# Patient Record
Sex: Female | Born: 1990 | Race: White | Hispanic: No | Marital: Married | Smoking: Current every day smoker
Health system: Southern US, Community
[De-identification: ages and names within clinical notes are randomized; demographics above are authoritative.]

---
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
## 2008-02-10 ENCOUNTER — Emergency Department: Payer: Self-pay | Admitting: Emergency Medicine

## 2010-05-07 ENCOUNTER — Emergency Department: Payer: Self-pay | Admitting: Emergency Medicine

## 2012-01-21 ENCOUNTER — Inpatient Hospital Stay: Payer: Self-pay

## 2012-01-22 LAB — CBC WITH DIFFERENTIAL/PLATELET
Basophil #: 0.1 10*3/uL (ref 0.0–0.1)
Basophil %: 0.3 %
Lymphocyte #: 3.5 10*3/uL (ref 1.0–3.6)
Lymphocyte %: 21.5 %
MCV: 84 fL (ref 80–100)
Monocyte #: 0.8 x10 3/mm (ref 0.2–0.9)
Monocyte %: 4.7 %
Platelet: 282 10*3/uL (ref 150–440)
WBC: 16.4 10*3/uL — ABNORMAL HIGH (ref 3.6–11.0)

## 2012-01-23 LAB — HEMATOCRIT: HCT: 27.7 % — ABNORMAL LOW (ref 35.0–47.0)

## 2013-08-19 ENCOUNTER — Ambulatory Visit: Payer: Self-pay | Admitting: Family Medicine

## 2013-08-26 ENCOUNTER — Ambulatory Visit: Payer: Self-pay | Admitting: Gastroenterology

## 2013-08-30 ENCOUNTER — Ambulatory Visit: Payer: Self-pay | Admitting: Surgery

## 2013-09-02 LAB — PATHOLOGY REPORT

## 2014-02-20 ENCOUNTER — Encounter: Payer: Self-pay | Admitting: Maternal and Fetal Medicine

## 2014-05-28 ENCOUNTER — Ambulatory Visit: Payer: Self-pay | Admitting: Neurology

## 2014-05-29 ENCOUNTER — Ambulatory Visit: Payer: Self-pay | Admitting: Neurology

## 2014-08-21 ENCOUNTER — Inpatient Hospital Stay: Payer: Self-pay | Admitting: Obstetrics and Gynecology

## 2014-08-22 LAB — CBC WITH DIFFERENTIAL/PLATELET
Basophil #: 0.1 10*3/uL (ref 0.0–0.1)
Basophil %: 0.4 %
EOS PCT: 0.9 %
Eosinophil #: 0.2 10*3/uL (ref 0.0–0.7)
HCT: 29.9 % — ABNORMAL LOW (ref 35.0–47.0)
HGB: 10 g/dL — ABNORMAL LOW (ref 12.0–16.0)
Lymphocyte #: 3.6 10*3/uL (ref 1.0–3.6)
Lymphocyte %: 20.5 %
MCH: 28.7 pg (ref 26.0–34.0)
MCHC: 33.4 g/dL (ref 32.0–36.0)
MCV: 86 fL (ref 80–100)
Monocyte #: 0.8 x10 3/mm (ref 0.2–0.9)
Monocyte %: 4.7 %
NEUTROS ABS: 12.8 10*3/uL — AB (ref 1.4–6.5)
Neutrophil %: 73.5 %
Platelet: 379 10*3/uL (ref 150–440)
RBC: 3.49 10*6/uL — AB (ref 3.80–5.20)
RDW: 14.5 % (ref 11.5–14.5)
WBC: 17.5 10*3/uL — ABNORMAL HIGH (ref 3.6–11.0)

## 2014-08-22 LAB — GC/CHLAMYDIA PROBE AMP

## 2014-08-24 LAB — HEMATOCRIT: HCT: 24.8 % — ABNORMAL LOW (ref 35.0–47.0)

## 2014-10-16 IMAGING — NM NUCLEAR MEDICINE HEPATOHBILIARY INCLUDE GB
2 series · 16 of 16 positions shown · non-contrast
Comparison: None.

RADIOPHARMACEUTICALS:  8.63 mDiUc-RRm Choletec

CLINICAL DATA: 22-year-old with right upper quadrant pain.

EXAM:
NUCLEAR MEDICINE HEPATOBILIARY IMAGING WITH GALLBLADDER EF
TECHNIQUE: Sequential images of the abdomen were obtained [DATE] minutes
following intravenous administration of radiopharmaceutical. After
oral ingestion of Ensure, gallbladder ejection fraction was
determined. At 60 min, normal ejection fraction is greater than 33%.

[Series 1000: gallbladder ef · 4.80mm/px · 6 of 120 frames shown]
[frame 11/120]
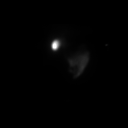
[frame 31/120]
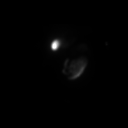
[frame 51/120]
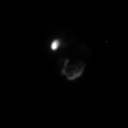
[frame 71/120]
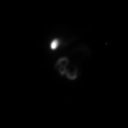
[frame 91/120]
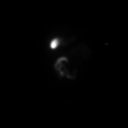
[frame 111/120]
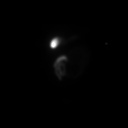

[Series 1000: gallbladder statics · 4.80mm/px · 10 of 10 slices shown]
[im 1/10]
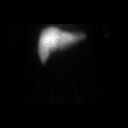
[im 2/10]
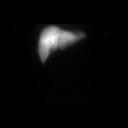
[im 3/10]
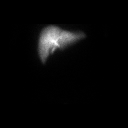
[im 4/10]
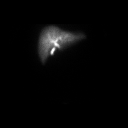
[im 5/10]
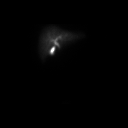
[im 6/10]
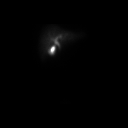
[im 7/10]
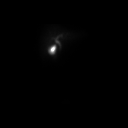
[im 8/10]
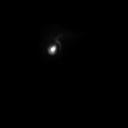
[im 9/10]
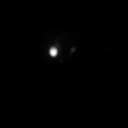
[im 10/10]
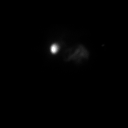

[16 of 16 positions shown; findings below may reference images not displayed]

FINDINGS: Radiopharmaceutical was taken up by the liver and excreted into the
biliary system. There is activity in the gallbladder and small
bowel. Gallbladder ejection fraction: 5%. Normal gallbladder
ejection fraction with Ensure is greater than 33%.
IMPRESSION: Decreased gallbladder ejection fraction, measuring 5%.

## 2015-01-16 NOTE — Op Note (Signed)
PATIENT NAME:  Jacqueline Cain, Jacqueline Cain MR#:  [id number] DATE OF BIRTH:  1991-01-15  DATE OF PROCEDURE:  08/30/2013  PREOPERATIVE DIAGNOSIS: Cholecystitis.   POSTOPERATIVE DIAGNOSIS: Cholecystitis.  PROCEDURE: Laparoscopic cholecystectomy, cholangiogram.   SURGEON: Renda Rolls, MD  ANESTHESIA: General.   INDICATION: This 24 year old female has the chief complaint of right upper quadrant abdominal pain which radiates around to the subscapular area of the back. Has had some associated fever. Had an ultrasound which demonstrated some layering of sediment in her gallbladder, also had an abnormally low gallbladder ejection fraction of 5% and surgery was recommended for definitive treatment.   DESCRIPTION OF PROCEDURE: The patient was placed on the operating table in the supine position under general endotracheal anesthesia. The abdomen was prepared with ChloraPrep and draped in a sterile manner.   A short incision was made in the inferior aspect of the umbilicus and carried down to the deep fascia, which was grasped with laryngeal hook, and used the trocar and advanced the laparoscope into the trocar and focused on the tissues, and dissected down through the fascia and into the peritoneal cavity using the laparoscopic guidance, and  insufflated the peritoneal cavity with carbon dioxide. The liver appeared normal. The gallbladder appeared to be distended. There was no other gross abnormalities seen with brief survey of the abdomen. Another incision was made in the epigastrium slightly to the right of the midline to introduce a 10 mm cannula. Two incisions were made in the lateral aspect of the right upper quadrant to introduce two 5 mm cannulas.   The patient was put in the reverse Trendelenburg position and turned several degrees to the left. The gallbladder was tense and was decompressed with lancing needle draining some dark bile. Next, the gallbladder was retracted towards the right shoulder. The pouch of  Jon Billings was retracted inferiorly and laterally. The common bile duct was identified. The gallbladder neck was mobilized with incision of the visceral peritoneum. Circumferential dissection was carried out around the cystic duct and around the cystic artery. A critical view of safety was demonstrated. An Endo Clip was placed across the cystic duct adjacent to the neck of the gallbladder. An incision was made in the cystic duct to introduce a Reddick catheter. Half-strength Conray-60 dye was injected as the cholangiogram was done with fluoroscopy viewing the biliary tree and flow of dye into the duodenum. No retained stones were seen. The Reddick catheter was removed. The cystic duct was doubly ligated with endoclips and divided. The cystic artery was controlled with double endoclips and divided. The gallbladder was dissected free from the liver with hook and cautery. There was no bleeding from this portion of the dissection. Hemostasis was intact. The gallbladder was delivered up through the infraumbilical incision, opened, and suctioned, removed and submitted in formalin for routine pathology. The right upper quadrant was further inspected. Hemostasis was intact. The cannulas were removed. Carbon dioxide was allowed to escape from the peritoneal cavity. The skin incisions were closed with interrupted 5-0 chromic subcuticular sutures, benzoin, and Steri-Strips. Dressings were applied with paper tape. The patient tolerated surgery satisfactorily and was prepared for transfer to the recovery room. ____________________________ Shela Commons. Renda Rolls, MD jws:sb D: 08/30/2013 09:06:52 ET T: 08/30/2013 09:19:22 ET JOB#: [id number]  cc: Adella Hare, MD, <Dictator> Adella Hare MD ELECTRONICALLY SIGNED 09/03/2013 19:27

## 2015-02-03 NOTE — H&P (Signed)
L&D Evaluation:  History:   HPI 24 year old G1P0 presents to L&D with c/o ctx's at 40 weeks 5 days. EDD 01/17/12, PNC at [facility] notable for early entry to care, Rh negative (rhogam rec'd at 28 weeks 10/26/11), NL anatomy scan, and NL genetic screening. No significant events during pregnancy. Was set up for IOL on 01/25/12 with Pitocin. Labs: B Negative, RI, Varicella non immune, GBS Negative    Presents with contractions    Patient's Medical History No Chronic Illness    Patient's Surgical History foot surgery    Medications Pre [place] Vitamins    Social History none    Family History Non-Contributory   ROS:   ROS All systems were reviewed.  HEENT, CNS, GI, GU, Respiratory, CV, Renal and Musculoskeletal systems were found to be normal.   Exam:   Vital Signs stable    Urine Protein not completed    General no apparent distress    Mental Status clear    Abdomen gravid, tender with contractions    Estimated Fetal Weight Average for gestational age    Back no CVAT    Edema no edema    Pelvic no external lesions, 2-3 cm/80/-1    Mebranes Intact    FHT normal rate with no decels    Ucx irregular, 5-10    Skin dry   Impression:   Impression early labor, reactive NST, postdates   Plan:   Plan EFM/NST, monitor contractions and for cervical change, admit for labor, possible augmentation    Comments Pt ambulated in hallways, cervix slight change from previous exam, will admit for early labor, will likely augment labor with AROM or Pitocin   Electronic Signatures: Shella Maxim (CNM)  (Signed 28-Apr-13 01:20)  Authored: L&D Evaluation   Last Updated: 28-Apr-13 01:20 by Shella Maxim (CNM)

## 2015-02-03 NOTE — H&P (Signed)
L&D Evaluation:  History:  HPI 24 yo G2P1001 with LMP of 11/21/13 & EDD of 08/28/14 here for IOL due to proteinuria: prot/creat 321, Anemia, Obesity,HyperhidrosisGest HTN, Rh neg, papilledema, tobacco abuse, HA, Anxiety, Depression seen by neuro for papilledema and tx with Diamox but, stopped taking due to hands and feet being numb. Dr. Dalbert Garnet scheduled IOL and aware of plan of care. Pt is advised of risks, benefits and alternatives and agrees with IOL and risk for lTCS, infection, bleeding, fetal or uterine intolerability. B neg, RI, GBS neg,   Presents with IOL   Patient's Medical History Obesity, Hyperhidrosis, Borderline HTN, Anxiety, Depression   Medications Wisdom teethj   Allergies NKDA   Social History tobacco  Tobacco Abuse   Family History Non-Contributory   ROS:  ROS All systems were reviewed.  HEENT, CNS, GI, GU, Respiratory, CV, Renal and Musculoskeletal systems were found to be normal.   Exam:  Vital Signs stable  BP 11/69   General no apparent distress   Mental Status clear   Chest clear   Heart normal sinus rhythm, no murmur/gallop/rubs   Abdomen gravid, non-tender   Estimated Fetal Weight Average for gestational age   Fetal Position vtx   Back no CVAT   Edema 1+   Reflexes 1+   Clonus negative   Pelvic 1.5/80/vtx-2   Mebranes Intact   FHT normal rate with no decels, Cat I   Ucx regular   Skin dry   Lymph no lymphadenopathy   Electronic Signatures: Sharee Pimple (CNM)  (Signed [phone] 23:45)  Authored: L&D Evaluation   Last Updated: 26-Nov-15 23:45 by Sharee Pimple (CNM)

## 2016-04-03 ENCOUNTER — Encounter: Payer: Self-pay | Admitting: Emergency Medicine

## 2016-04-03 ENCOUNTER — Emergency Department: Payer: Managed Care, Other (non HMO)

## 2016-04-03 ENCOUNTER — Emergency Department
Admission: EM | Admit: 2016-04-03 | Discharge: 2016-04-04 | Disposition: A | Discharge status: Home / Self Care | Payer: Managed Care, Other (non HMO) | Attending: Emergency Medicine | Admitting: Emergency Medicine

## 2016-04-03 DIAGNOSIS — S99921A Unspecified injury of right foot, initial encounter: Secondary | ICD-10-CM | POA: Diagnosis present

## 2016-04-03 DIAGNOSIS — F17210 Nicotine dependence, cigarettes, uncomplicated: Secondary | ICD-10-CM | POA: Insufficient documentation

## 2016-04-03 DIAGNOSIS — W228XXA Striking against or struck by other objects, initial encounter: Secondary | ICD-10-CM | POA: Insufficient documentation

## 2016-04-03 DIAGNOSIS — Y999 Unspecified external cause status: Secondary | ICD-10-CM | POA: Diagnosis not present

## 2016-04-03 DIAGNOSIS — S92341A Displaced fracture of fourth metatarsal bone, right foot, initial encounter for closed fracture: Secondary | ICD-10-CM | POA: Insufficient documentation

## 2016-04-03 DIAGNOSIS — Y929 Unspecified place or not applicable: Secondary | ICD-10-CM | POA: Diagnosis not present

## 2016-04-03 DIAGNOSIS — Y9302 Activity, running: Secondary | ICD-10-CM | POA: Insufficient documentation

## 2016-04-03 DIAGNOSIS — S92301A Fracture of unspecified metatarsal bone(s), right foot, initial encounter for closed fracture: Secondary | ICD-10-CM

## 2016-04-03 MED ORDER — IBUPROFEN 800 MG PO TABS
800.0000 mg | ORAL_TABLET | Freq: Once | ORAL | Status: AC
  Administered 2016-04-03: 800 mg via ORAL
  Filled 2016-04-03: qty 1

## 2016-04-03 NOTE — ED Notes (Signed)
Running, hit the slip and slide and twisted foot (right

## 2016-04-03 NOTE — ED Notes (Signed)
X-ray at bedside

## 2016-04-03 NOTE — ED Provider Notes (Signed)
[facility] Emergency Department Provider Note        Time seen: ----------------------------------------- 10:52 PM on 04/03/2016 -----------------------------------------    I have reviewed the triage vital signs and the nursing notes.   HISTORY  Chief Complaint Foot Injury    HPI Jacqueline Cain is a 25 y.o. female who presents to ER for right foot injury. Patient states she was running, hit a slip and slide and twisted her right foot. She is complaining of severe pain over the top of right foot. She denies any other injuries or complaints. This event occurred this evening. Pain with ambulation.   History reviewed. No pertinent past medical history.  There are no active problems to display for this patient.   History reviewed. No pertinent past surgical history.  Allergies Review of patient's allergies indicates no known allergies.  Social History Social History  Substance Use Topics  . Smoking status: Current Every Day Smoker -- 1.00 packs/day    Types: Cigarettes  . Smokeless tobacco: None  . Alcohol Use: Yes     Comment: weekly    Review of Systems Constitutional: Negative for fever. Musculoskeletal: Positive for right foot pain Skin: Negative for rash. Neurological: Negative for headaches, focal weakness or numbness.   ____________________________________________   PHYSICAL EXAM:  VITAL SIGNS: ED Triage Vitals  Enc Vitals Group     BP 04/03/16 2245 133/82 mmHg     Pulse Rate 04/03/16 2245 99     Resp 04/03/16 2245 16     Temp 04/03/16 2245 98.4 F (36.9 C)     Temp src --      SpO2 04/03/16 2245 100 %     Weight 04/03/16 2245 184 lb (83.462 kg)     Height 04/03/16 2245 5\' 4"  (1.626 m)     Head Cir --      Peak Flow --      Pain Score 04/03/16 2243 10     Pain Loc --      Pain Edu? --      Excl. in GC? --     Constitutional: Alert and oriented. Well appearing and in no distress. Musculoskeletal: Tenderness and  marked swelling over the dorsum of the right foot. No significant tenderness over the ankle, no tenderness in the right lower extremity proximally Neurologic:  Normal speech and language. No gross focal neurologic deficits are appreciated.  Skin:  Skin is warm, dry and intact. Right dorsal foot contusion Psychiatric: Mood and affect are normal. Speech and behavior are normal.  ____________________________________________  ED COURSE:  Pertinent labs & imaging results that were available during my care of the patient were reviewed by me and considered in my medical decision making (see chart for details). Patient with a right foot injury, we will obtain basic imaging, she is requesting oral ibuprofen ____________________________________________  RADIOLOGY Images were viewed by me  Right foot x-rays Metatarsal fracture  IMPRESSION: Comminuted fractures demonstrated at the base of the second and fourth metatarsal bones with probable avulsion fractures in the Lisfranc's joint. Soft tissue swelling. ____________________________________________  FINAL ASSESSMENT AND PLAN  Metatarsal fractures  Plan: Patient with imaging as dictated above. Patient presents after a right foot injury, x-rays revealed metatarsal fractures. I have discussed with orthopedics Dr. Hyacinth Meeker recommends CT scan of the right foot. He is also recommended a large bulky dressing, nonweight bearing and follow-up in 2 days in his office.   Emily Filbert, MD   Note: This dictation was prepared with  Office manager. Any transcriptional errors that result from this process are unintentional   Emily Filbert, MD 04/04/16 [phone]

## 2016-04-03 NOTE — ED Notes (Signed)
Pt states she took 2 aleeve and iced R foot when injury occurred around 5:30. Noticeable swelling and bruising to top of foot. Good pink color to foot and pedal pulses felt.

## 2016-04-04 ENCOUNTER — Emergency Department: Payer: Managed Care, Other (non HMO)

## 2016-04-04 MED ORDER — OXYCODONE-ACETAMINOPHEN 5-325 MG PO TABS
2.0000 | ORAL_TABLET | Freq: Once | ORAL | Status: AC
  Administered 2016-04-04: 2 via ORAL
  Filled 2016-04-04: qty 2

## 2016-04-04 MED ORDER — ONDANSETRON 4 MG PO TBDP
4.0000 mg | ORAL_TABLET | Freq: Once | ORAL | Status: AC
  Administered 2016-04-04: 4 mg via ORAL
  Filled 2016-04-04: qty 1

## 2016-04-04 MED ORDER — OXYCODONE-ACETAMINOPHEN 5-325 MG PO TABS: 2.0000 | ORAL_TABLET | Freq: Four times a day (QID) | ORAL | Status: AC | PRN

## 2016-04-04 NOTE — ED Provider Notes (Signed)
-----------------------------------------   2:27 AM on 04/04/2016 -----------------------------------------  CT right foot without contrast interpreted per Dr. Gwenyth Bender: Inter articular fractures of the bases of the second-fourth metatarsals as well as avulsion injury of the base of the first metatarsal and avulsion injuries of the tarsal bones as described. There is avulsion injury from the medial base of the second metatarsal along the Lisfranc ligament.  SPLINT APPLICATION Date/Time: 2:28 AM Authorized by: Irean Hong Consent: Verbal consent obtained. Risks and benefits: risks, benefits and alternatives were discussed Consent given by: patient Splint applied by: ED technician Location details: right foot/leg Splint type: bulky padding to dorsal foot; posterior short leg splint Supplies used: webril, kerlix, OCL, ace bandage Post-procedure: The splinted body part was neurovascularly unchanged following the procedure. Patient tolerance: Patient tolerated the procedure well with no immediate complications.    Irean Hong, MD 04/04/16 [phone]

## 2016-04-04 NOTE — Discharge Instructions (Signed)
Metatarsal Fracture A metatarsal fracture is a break in a metatarsal bone. Metatarsal bones connect your toe bones to your ankle bones. CAUSES This type of fracture may be caused by:  A sudden twisting of your foot.  A fall onto your foot.  Overuse or repetitive exercise. RISK FACTORS This condition is more likely to develop in people who:  Play contact sports.  Have a bone disease.  Have a low calcium level. SYMPTOMS Symptoms of this condition include:  Pain that is worse when walking or standing.  Pain when pressing on the foot or moving the toes.  Swelling.  Bruising on the top or bottom of the foot.  A foot that appears shorter than the other one. DIAGNOSIS This condition is diagnosed with a physical exam. You may also have imaging tests, such as:  X-rays.  A CT scan.  MRI. TREATMENT Treatment for this condition depends on its severity and whether a bone has moved out of place. Treatment may involve:  Rest.  Wearing foot support such as a cast, splint, or boot for several weeks.  Using crutches.  Surgery to move bones back into the right position. Surgery is usually needed if there are many pieces of broken bone or bones that are very out of place (displaced fracture).  Physical therapy. This may be needed to help you regain full movement and strength in your foot. You will need to return to your health care provider to have X-rays taken until your bones heal. Your health care provider will look at the X-rays to make sure that your foot is healing well. HOME CARE INSTRUCTIONS  If You Have a Cast:  Do not stick anything inside the cast to scratch your skin. Doing that increases your risk of infection.  Check the skin around the cast every day. Report any concerns to your health care provider. You may put lotion on dry skin around the edges of the cast. Do not apply lotion to the skin underneath the cast.  Keep the cast clean and dry. If You Have a Splint  or a Supportive Boot:  Wear it as directed by your health care provider. Remove it only as directed by your health care provider.  Loosen it if your toes become numb and tingle, or if they turn cold and blue.  Keep it clean and dry. Bathing  Do not take baths, swim, or use a hot tub until your health care provider approves. Ask your health care provider if you can take showers. You may only be allowed to take sponge baths for bathing.  If your health care provider approves bathing and showering, cover the cast or splint with a watertight plastic bag to protect it from water. Do not let the cast or splint get wet. Managing Pain, Stiffness, and Swelling  If directed, apply ice to the injured area (if you have a splint, not a cast).  Put ice in a plastic bag.  Place a towel between your skin and the bag.  Leave the ice on for 20 minutes, 2-3 times per day.  Move your toes often to avoid stiffness and to lessen swelling.  Raise (elevate) the injured area above the level of your heart while you are sitting or lying down. Driving  Do not drive or operate heavy machinery while taking pain medicine.  Do not drive while wearing foot support on a foot that you use for driving. Activity  Return to your normal activities as directed by your health care   provider. Ask your health care provider what activities are safe for you.  Perform exercises as directed by your health care provider or physical therapist. Safety  Do not use the injured foot to support your body weight until your health care provider says that you can. Use crutches as directed by your health care provider. General Instructions  Do not put pressure on any part of the cast or splint until it is fully hardened. This may take several hours.  Do not use any tobacco products, including cigarettes, chewing tobacco, or e-cigarettes. Tobacco can delay bone healing. If you need help quitting, ask your health care  provider.  Take medicines only as directed by your health care provider.  Keep all follow-up visits as directed by your health care provider. This is important. SEEK MEDICAL CARE IF:  You have a fever.  Your cast, splint, or boot is too loose or too tight.  Your cast, splint, or boot is damaged.  Your pain medicine is not helping.  You have pain, tingling, or numbness in your foot that is not going away. SEEK IMMEDIATE MEDICAL CARE IF:  You have severe pain.  You have tingling or numbness in your foot that is getting worse.  Your foot feels cold or becomes numb.  Your foot changes color.   This information is not intended to replace advice given to you by your health care provider. Make sure you discuss any questions you have with your health care provider.   Document Released: 06/04/2002 Document Revised: 01/27/2015 Document Reviewed: 07/09/2014 Elsevier Interactive Patient Education 2016 [employer]  

## 2019-02-15 ENCOUNTER — Ambulatory Visit
Admission: RE | Admit: 2019-02-15 | Discharge: 2019-02-15 | Disposition: A | Discharge status: Home / Self Care | Payer: 59 | Source: Ambulatory Visit | Attending: Internal Medicine | Admitting: Internal Medicine

## 2019-02-15 ENCOUNTER — Other Ambulatory Visit: Payer: Self-pay | Admitting: Internal Medicine

## 2019-02-15 ENCOUNTER — Other Ambulatory Visit: Payer: Self-pay

## 2019-02-15 DIAGNOSIS — M7989 Other specified soft tissue disorders: Secondary | ICD-10-CM | POA: Insufficient documentation

## 2019-02-15 DIAGNOSIS — M79661 Pain in right lower leg: Secondary | ICD-10-CM | POA: Insufficient documentation

## 2019-03-07 ENCOUNTER — Other Ambulatory Visit: Payer: Self-pay | Admitting: Internal Medicine

## 2019-03-07 DIAGNOSIS — M5441 Lumbago with sciatica, right side: Secondary | ICD-10-CM

## 2019-03-18 ENCOUNTER — Ambulatory Visit
Admission: RE | Admit: 2019-03-18 | Discharge: 2019-03-18 | Disposition: A | Discharge status: Home / Self Care | Payer: 59 | Source: Ambulatory Visit | Attending: Internal Medicine | Admitting: Internal Medicine

## 2019-03-18 ENCOUNTER — Other Ambulatory Visit: Payer: Self-pay

## 2019-03-18 DIAGNOSIS — M5441 Lumbago with sciatica, right side: Secondary | ICD-10-CM | POA: Diagnosis not present

## 2019-06-14 ENCOUNTER — Other Ambulatory Visit: Payer: Self-pay | Admitting: Acute Care

## 2019-06-14 DIAGNOSIS — G35 Multiple sclerosis: Secondary | ICD-10-CM

## 2019-06-16 ENCOUNTER — Other Ambulatory Visit: Payer: Self-pay

## 2019-06-16 ENCOUNTER — Ambulatory Visit
Admission: RE | Admit: 2019-06-16 | Discharge: 2019-06-16 | Disposition: A | Discharge status: Home / Self Care | Payer: BC Managed Care – PPO | Source: Ambulatory Visit | Attending: Acute Care | Admitting: Acute Care

## 2019-06-16 DIAGNOSIS — G35 Multiple sclerosis: Secondary | ICD-10-CM | POA: Insufficient documentation

## 2019-06-16 MED ORDER — GADOBUTROL 1 MMOL/ML IV SOLN
8.0000 mL | Freq: Once | INTRAVENOUS | Status: AC | PRN
  Administered 2019-06-16: 8 mL via INTRAVENOUS

## 2019-09-10 ENCOUNTER — Other Ambulatory Visit: Payer: Self-pay | Admitting: Psychiatry

## 2019-09-10 DIAGNOSIS — R29898 Other symptoms and signs involving the musculoskeletal system: Secondary | ICD-10-CM

## 2019-09-10 DIAGNOSIS — G959 Disease of spinal cord, unspecified: Secondary | ICD-10-CM

## 2019-09-24 ENCOUNTER — Ambulatory Visit
Admission: RE | Admit: 2019-09-24 | Discharge: 2019-09-24 | Disposition: A | Discharge status: Home / Self Care | Payer: BC Managed Care – PPO | Source: Ambulatory Visit | Attending: Psychiatry | Admitting: Psychiatry

## 2019-09-24 ENCOUNTER — Other Ambulatory Visit: Payer: Self-pay

## 2019-09-24 DIAGNOSIS — R29898 Other symptoms and signs involving the musculoskeletal system: Secondary | ICD-10-CM | POA: Diagnosis present

## 2019-09-24 DIAGNOSIS — G959 Disease of spinal cord, unspecified: Secondary | ICD-10-CM | POA: Insufficient documentation

## 2019-09-24 MED ORDER — GADOBUTROL 1 MMOL/ML IV SOLN
8.0000 mL | Freq: Once | INTRAVENOUS | Status: AC | PRN
  Administered 2019-09-24: 8 mL via INTRAVENOUS

## 2020-07-23 ENCOUNTER — Other Ambulatory Visit: Payer: Self-pay | Admitting: Obstetrics and Gynecology

## 2020-07-23 DIAGNOSIS — N644 Mastodynia: Secondary | ICD-10-CM

## 2020-07-30 ENCOUNTER — Ambulatory Visit
Admission: RE | Admit: 2020-07-30 | Discharge: 2020-07-30 | Disposition: A | Discharge status: Home / Self Care | Payer: BC Managed Care – PPO | Source: Ambulatory Visit | Attending: Obstetrics and Gynecology | Admitting: Obstetrics and Gynecology

## 2020-07-30 ENCOUNTER — Other Ambulatory Visit: Payer: Self-pay

## 2020-07-30 DIAGNOSIS — N644 Mastodynia: Secondary | ICD-10-CM | POA: Insufficient documentation

## 2022-07-19 ENCOUNTER — Other Ambulatory Visit: Payer: Self-pay

## 2022-07-22 ENCOUNTER — Ambulatory Visit (LOCAL_COMMUNITY_HEALTH_CENTER): Payer: Self-pay

## 2022-07-22 DIAGNOSIS — Z111 Encounter for screening for respiratory tuberculosis: Secondary | ICD-10-CM

## 2022-07-22 NOTE — Progress Notes (Signed)
Pt in nurse clinic to have PPD skin test read.   PPD was placed 07/19/22 right forearm in [place] with instructions to read 07/21/22 or 07/22/22.    PPDR negative 39mm induration.  Results recorded on form she brought with her.    Tonny Branch, RN

## 2023-02-07 ENCOUNTER — Emergency Department
Admission: EM | Admit: 2023-02-07 | Discharge: 2023-02-07 | Discharge status: Against Medical Advice | Payer: No Typology Code available for payment source | Attending: Emergency Medicine | Admitting: Emergency Medicine

## 2023-02-07 ENCOUNTER — Encounter: Payer: Self-pay | Admitting: Emergency Medicine

## 2023-02-07 DIAGNOSIS — Z7721 Contact with and (suspected) exposure to potentially hazardous body fluids: Secondary | ICD-10-CM | POA: Insufficient documentation

## 2023-02-07 DIAGNOSIS — Z5321 Procedure and treatment not carried out due to patient leaving prior to being seen by health care provider: Secondary | ICD-10-CM | POA: Diagnosis not present

## 2023-02-07 NOTE — ED Notes (Addendum)
Pt does not have [employer] workers comp paperwork; explained to [employer] that orab swabbing only performed in the ED for any workers comp testing requests; Spoke with Cameron Proud who st she will inform the supervisor of need for employee to f/u tomorrow regarding workers comp testing requirements
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
## 2023-02-07 NOTE — ED Notes (Signed)
Supervisor to desk to st that they are leaving since "we aren't doing the baseline blood work"; when asked to elaborate, supervisor st he is wanting "baseline blood work to check for HIV and such"; explained to pt & supervisor that they are going to be seen evaluated and treated by the provider regarding such; explained to pt & supervisor it is only the workers comp testing that will be needed to f/u with tomorrow; both cont to st they are just going to leave
